# Patient Record
Sex: Female | Born: 1955 | Race: White | Hispanic: No
Health system: Southern US, Community
[De-identification: ages and names within clinical notes are randomized; demographics above are authoritative.]

---

## 2014-03-16 ENCOUNTER — Inpatient Hospital Stay: Payer: Self-pay | Admitting: Internal Medicine

## 2014-03-17 LAB — PROTIME-INR
INR: 1.3
PROTHROMBIN TIME: 15.7 s — AB (ref 11.5–14.7)

## 2014-03-17 LAB — CBC WITH DIFFERENTIAL/PLATELET
BASOS PCT: 0.5 %
Basophil #: 0 10*3/uL (ref 0.0–0.1)
Eosinophil #: 0.1 10*3/uL (ref 0.0–0.7)
Eosinophil %: 2.2 %
HCT: 33.9 % — ABNORMAL LOW (ref 35.0–47.0)
HGB: 10.9 g/dL — ABNORMAL LOW (ref 12.0–16.0)
Lymphocyte #: 1.6 10*3/uL (ref 1.0–3.6)
Lymphocyte %: 28.8 %
MCH: 29.6 pg (ref 26.0–34.0)
MCHC: 32.3 g/dL (ref 32.0–36.0)
MCV: 92 fL (ref 80–100)
Monocyte #: 0.7 x10 3/mm (ref 0.2–0.9)
Monocyte %: 12.3 %
NEUTROS ABS: 3.1 10*3/uL (ref 1.4–6.5)
NEUTROS PCT: 56.2 %
PLATELETS: 405 10*3/uL (ref 150–440)
RBC: 3.7 10*6/uL — ABNORMAL LOW (ref 3.80–5.20)
RDW: 18 % — AB (ref 11.5–14.5)
WBC: 5.5 10*3/uL (ref 3.6–11.0)

## 2014-03-17 LAB — BASIC METABOLIC PANEL
ANION GAP: 6 — AB (ref 7–16)
BUN: 1 mg/dL — AB (ref 7–18)
CHLORIDE: 110 mmol/L — AB (ref 98–107)
CO2: 23 mmol/L (ref 21–32)
CREATININE: 1.04 mg/dL (ref 0.60–1.30)
Calcium, Total: 8.7 mg/dL (ref 8.5–10.1)
EGFR (African American): >60
EGFR (Non-African Amer.): 58 — ABNORMAL LOW
GLUCOSE: 98 mg/dL (ref 65–99)
Osmolality: 273 (ref 275–301)
POTASSIUM: 4.1 mmol/L (ref 3.5–5.1)
Sodium: 139 mmol/L (ref 136–145)

## 2014-03-17 LAB — TPN PANEL
ALBUMIN: 2.4 g/dL — AB (ref 3.4–5.0)
ALK PHOS: 55 U/L
Activated PTT: 32.7 secs (ref 23.6–35.9)
Anion Gap: 7 (ref 7–16)
BUN: 2 mg/dL — AB (ref 7–18)
CALCIUM: 8.2 mg/dL — AB (ref 8.5–10.1)
CO2: 25 mmol/L (ref 21–32)
Chloride: 109 mmol/L — ABNORMAL HIGH (ref 98–107)
Cholesterol: 64 mg/dL (ref 0–200)
Creatinine: 0.97 mg/dL (ref 0.60–1.30)
EGFR (African American): >60
EGFR (Non-African Amer.): >60
Glucose: 88 mg/dL (ref 65–99)
HGB: 9.6 g/dL — ABNORMAL LOW (ref 12.0–16.0)
INR: 1.2
Magnesium: 1 mg/dL — ABNORMAL LOW
Osmolality: 277 (ref 275–301)
PHOSPHORUS: 3.4 mg/dL (ref 2.5–4.9)
PLATELETS: 340 10*3/uL (ref 150–440)
Potassium: 3.9 mmol/L (ref 3.5–5.1)
Prothrombin Time: 15.3 secs — ABNORMAL HIGH (ref 11.5–14.7)
SGOT(AST): 23 U/L (ref 15–37)
SODIUM: 141 mmol/L (ref 136–145)
Total Protein: 5.4 g/dL — ABNORMAL LOW (ref 6.4–8.2)
Triglycerides: 63 mg/dL (ref 0–200)
WBC: 5.3 10*3/uL (ref 3.6–11.0)

## 2014-03-17 LAB — HEPATIC FUNCTION PANEL A (ARMC)
ALK PHOS: 51 U/L
Albumin: 2.5 g/dL — ABNORMAL LOW (ref 3.4–5.0)
Bilirubin, Direct: <0.1 mg/dL (ref 0.00–0.20)
Bilirubin,Total: 0.3 mg/dL (ref 0.2–1.0)
SGOT(AST): 28 U/L (ref 15–37)
SGPT (ALT): 23 U/L (ref 12–78)
Total Protein: 5.1 g/dL — ABNORMAL LOW (ref 6.4–8.2)

## 2014-03-17 LAB — SEDIMENTATION RATE: Erythrocyte Sed Rate: 26 mm/hr (ref 0–30)

## 2014-03-18 LAB — PHOSPHORUS: PHOSPHORUS: 5.2 mg/dL — AB (ref 2.5–4.9)

## 2014-03-18 LAB — ALBUMIN: Albumin: 2.4 g/dL — ABNORMAL LOW (ref 3.4–5.0)

## 2014-03-18 LAB — SODIUM: Sodium: 136 mmol/L (ref 136–145)

## 2014-03-18 LAB — POTASSIUM: Potassium: 4.5 mmol/L (ref 3.5–5.1)

## 2014-03-18 LAB — CALCIUM: Calcium, Total: 7.9 mg/dL — ABNORMAL LOW (ref 8.5–10.1)

## 2014-03-18 LAB — MAGNESIUM: MAGNESIUM: 1.3 mg/dL — AB

## 2014-03-19 LAB — SODIUM: Sodium: 141 mmol/L (ref 136–145)

## 2014-03-19 LAB — CALCIUM: Calcium, Total: 8.1 mg/dL — ABNORMAL LOW (ref 8.5–10.1)

## 2014-03-19 LAB — POTASSIUM: Potassium: 3.6 mmol/L (ref 3.5–5.1)

## 2014-03-19 LAB — PHOSPHORUS: Phosphorus: 4.1 mg/dL (ref 2.5–4.9)

## 2014-03-19 LAB — MAGNESIUM: Magnesium: 1.7 mg/dL — ABNORMAL LOW

## 2014-03-20 LAB — BASIC METABOLIC PANEL
ANION GAP: 4 — AB (ref 7–16)
BUN: 8 mg/dL (ref 7–18)
CALCIUM: 8 mg/dL — AB (ref 8.5–10.1)
CHLORIDE: 106 mmol/L (ref 98–107)
CO2: 29 mmol/L (ref 21–32)
CREATININE: 0.94 mg/dL (ref 0.60–1.30)
EGFR (African American): >60
EGFR (Non-African Amer.): >60
Glucose: 115 mg/dL — ABNORMAL HIGH (ref 65–99)
OSMOLALITY: 277 (ref 275–301)
POTASSIUM: 3.6 mmol/L (ref 3.5–5.1)
Sodium: 139 mmol/L (ref 136–145)

## 2014-03-20 LAB — MAGNESIUM: Magnesium: 1.8 mg/dL

## 2014-03-20 LAB — PLATELET COUNT: PLATELETS: 270 10*3/uL (ref 150–440)

## 2014-03-20 LAB — PHOSPHORUS: Phosphorus: 4.5 mg/dL (ref 2.5–4.9)

## 2014-03-21 LAB — BASIC METABOLIC PANEL
Anion Gap: 4 — ABNORMAL LOW (ref 7–16)
BUN: 13 mg/dL (ref 7–18)
CO2: 31 mmol/L (ref 21–32)
Calcium, Total: 8.3 mg/dL — ABNORMAL LOW (ref 8.5–10.1)
Chloride: 105 mmol/L (ref 98–107)
Creatinine: 0.88 mg/dL (ref 0.60–1.30)
GLUCOSE: 112 mg/dL — AB (ref 65–99)
OSMOLALITY: 280 (ref 275–301)
Potassium: 3.9 mmol/L (ref 3.5–5.1)
SODIUM: 140 mmol/L (ref 136–145)

## 2014-03-21 LAB — PHOSPHORUS: PHOSPHORUS: 4.8 mg/dL (ref 2.5–4.9)

## 2014-03-21 LAB — MAGNESIUM: Magnesium: 2.1 mg/dL

## 2014-03-21 LAB — PROT IMMUNOELECTROPHORES(ARMC)

## 2014-03-22 LAB — SODIUM: SODIUM: 140 mmol/L (ref 136–145)

## 2014-03-22 LAB — PHOSPHORUS: Phosphorus: 4.5 mg/dL (ref 2.5–4.9)

## 2014-03-22 LAB — MAGNESIUM: Magnesium: 2 mg/dL

## 2014-03-22 LAB — POTASSIUM: POTASSIUM: 3.9 mmol/L (ref 3.5–5.1)

## 2014-03-22 LAB — CALCIUM: CALCIUM: 8.6 mg/dL (ref 8.5–10.1)

## 2014-03-23 LAB — MAGNESIUM: Magnesium: 1.8 mg/dL

## 2014-03-23 LAB — ALBUMIN: Albumin: 2.4 g/dL — ABNORMAL LOW (ref 3.4–5.0)

## 2014-03-23 LAB — POTASSIUM: Potassium: 4 mmol/L (ref 3.5–5.1)

## 2014-03-23 LAB — SODIUM: Sodium: 138 mmol/L (ref 136–145)

## 2014-03-23 LAB — CALCIUM: Calcium, Total: 8.6 mg/dL (ref 8.5–10.1)

## 2014-03-23 LAB — PHOSPHORUS: Phosphorus: 4.9 mg/dL (ref 2.5–4.9)

## 2014-03-24 LAB — SODIUM: Sodium: 137 mmol/L (ref 136–145)

## 2014-03-24 LAB — MAGNESIUM: Magnesium: 1.9 mg/dL

## 2014-03-24 LAB — POTASSIUM: Potassium: 4.4 mmol/L (ref 3.5–5.1)

## 2014-03-24 LAB — CALCIUM: CALCIUM: 8.5 mg/dL (ref 8.5–10.1)

## 2014-03-24 LAB — PHOSPHORUS: PHOSPHORUS: 4.7 mg/dL (ref 2.5–4.9)

## 2014-03-25 LAB — CALCIUM: Calcium, Total: 8.6 mg/dL (ref 8.5–10.1)

## 2014-03-25 LAB — SODIUM: SODIUM: 139 mmol/L (ref 136–145)

## 2014-03-25 LAB — CREATININE, SERUM
Creatinine: 1.13 mg/dL (ref 0.60–1.30)
EGFR (African American): >60
GFR CALC NON AF AMER: 53 — AB

## 2014-03-25 LAB — MAGNESIUM: MAGNESIUM: 1.9 mg/dL

## 2014-03-25 LAB — PHOSPHORUS: PHOSPHORUS: 4.5 mg/dL (ref 2.5–4.9)

## 2014-03-25 LAB — POTASSIUM: Potassium: 4.2 mmol/L (ref 3.5–5.1)

## 2014-03-26 LAB — POTASSIUM: Potassium: 4.1 mmol/L (ref 3.5–5.1)

## 2014-03-26 LAB — PHOSPHORUS: PHOSPHORUS: 4.4 mg/dL (ref 2.5–4.9)

## 2014-03-26 LAB — MAGNESIUM: Magnesium: 1.8 mg/dL

## 2014-03-26 LAB — SODIUM: Sodium: 136 mmol/L (ref 136–145)

## 2014-03-26 LAB — CALCIUM: Calcium, Total: 8.7 mg/dL (ref 8.5–10.1)

## 2015-03-04 NOTE — Op Note (Signed)
PATIENT NAME:  Carmen CaffeyHILLIPS, Myisha F MR#:  [id number] DATE OF BIRTH:  03/16/1956  DATE OF PROCEDURE:  03/17/2014  PREOPERATIVE DIAGNOSES:  1. Clostridium difficile colitis.  2. Hypothyroidism.  3. Obesity.   POSTOPERATIVE DIAGNOSES:  1. Clostridium difficile colitis.  2. Hypothyroidism.  3. Obesity.  PROCEDURES:  1. Ultrasound guidance for vascular access to left basilic vein.  2. Fluoroscopic guidance for placement of catheter.  3. Insertion of peripherally inserted central venous catheter, double lumen, left arm.  SURGEON: Dr. Festus BarrenJason Levent Kornegay  ANESTHESIA: Local.   ESTIMATED BLOOD LOSS: Minimal.   INDICATION FOR PROCEDURE: A 59 year old female with Clostridium difficile colitis who needs extended IV therapy.   DESCRIPTION OF PROCEDURE: The patient's left arm was sterilely prepped and draped, and a sterile surgical field was created. The left basilic vein was accessed under direct ultrasound guidance without difficulty with a micropuncture needle and permanent image was recorded. 0.018 wire was then placed into the superior vena cava. Peel-away sheath was placed over the wire. A single lumen peripherally inserted central venous catheter was then placed over the wire and the wire and peel-away sheath were removed. The catheter tip was placed into the superior vena cava and was secured at the skin at 40 cm with a sterile dressing. The catheter withdrew blood well and flushed easily with heparinized saline. The patient tolerated procedure well.    ____________________________ Annice NeedyJason S. Sheva Mcdougle, MD jsd:dd D: 03/17/2014 14:58:28 ET T: 03/18/2014 01:25:57 ET JOB#: [id number]  cc: Annice NeedyJason S. Kamarion Zagami, MD, <Dictator> Annice NeedyJASON S Icie Kuznicki MD ELECTRONICALLY SIGNED 03/24/2014 15:34

## 2015-03-04 NOTE — Consult Note (Signed)
Colonoscopy done, lining looked good, stool transplant done per usual protocol starting in terminal ileum.  Will stop TPN, give low residue diet and very likely home tomorrow.  I will see her in late morning and after that if no new problems can go home on yogurt and Florastar probiotic and mechanical soft diet following instructions per her bariatric surgeon.  Electronic Signatures: Scot JunElliott, Sherrilyn Nairn T (MD)  (Signed on 15-May-15 15:54)  Authored  Last Updated: 15-May-15 15:54 by Scot JunElliott, Atilano Covelli T (MD)

## 2015-03-04 NOTE — H&P (Signed)
PATIENT NAME:  Carmen Thomas, Carmen Thomas MR#:  [id number] DATE OF BIRTH:  1956/08/03  DATE OF ADMISSION:  03/16/2014  ADDENDUM:   ALLERGIES: TAPE.   HOME MEDICATIONS: Include acetaminophen hydrocodone 325/7.5 mg 10 mL every 6 hours as needed for pain, lorazepam 1 mg p.o. 3 times a day as needed for anxiety, Zofran 4 mg p.o. every 6 hours as needed for nausea or vomiting, scopolamine 1.5 mg transdermal patch every 72 hours, Ambien 5 mg p.o. at bedtime as needed for sleep, Restoril 15 mg p.o. at bedtime, levothyroxine 150 mcg p.o. daily, Theragran multivitamin 1 tablet p.o. daily.   LABORATORY DATA:  chloride 110, bicarbonate 23, BUN 1, creatinine 1.04, glucose 98. WBC 5.5, hemoglobin 10.9, platelets of 405,000.   ____________________________ Cletis Athensavid K. Hower, MD dkh:dd D: 03/17/2014 00:59:52 ET T: 03/17/2014 01:45:05 ET JOB#: [id number]  cc: Cletis Athensavid K. Hower, MD, <Dictator> DAVID Synetta ShadowK HOWER MD ELECTRONICALLY SIGNED 03/18/2014 1:01

## 2015-03-04 NOTE — Consult Note (Signed)
Pt seen, consult to be dictated.  Will treat with oral vancomycin, IV TPN due to severe malnutrition with albumin of 2.5, do flex sig this weekend, do stool transplant after improved, may need gamma globulin infusion if IgG low, waiting on SPIE.  Pic line inserted today.    Electronic Signatures: Scot JunElliott, Robert T (MD)  (Signed on 07-May-15 16:37)  Authored  Last Updated: 07-May-15 16:37 by Scot JunElliott, Robert T (MD)

## 2015-03-04 NOTE — Consult Note (Signed)
Pt SPIE showed no gamma globulin deficiency, were low normal.  Stools are mushy, not watery.  Still on iv tpn for severe malnutrition.  to have stool transplant Friday, prep on Thursday. Stop Vancomycin after midnight tomorrow night, no vancomycin or other antibiotics Wed and Thursday. Pt in good spirits, less abd pain, no vomiting, BP better.   Electronic Signatures: Scot JunElliott, Robert T (MD)  (Signed on 11-May-15 17:31)  Authored  Last Updated: 11-May-15 17:31 by Scot JunElliott, Robert T (MD)

## 2015-03-04 NOTE — Consult Note (Signed)
Pt has some pain in abd in left upper, had almost formed stools today, no nausea, no vomiting, no chills, fever.  Will stop vancomycin after today, current diet tomorrow and one gallon  Golytely Thursday afternoon and a clear liq diet all day Thursday and colonoscopy with stool transplant Friday.  Proably watch over weekend and home early next week.  Wil check albumin and prealbumin Thursday.  Pt eating yogurt , seems to be in good spirits.  Electronic Signatures: Scot JunElliott, Denise Washburn T (MD)  (Signed on 12-May-15 18:16)  Authored  Last Updated: 12-May-15 18:16 by Scot JunElliott, Leverett Camplin T (MD)

## 2015-03-04 NOTE — Consult Note (Signed)
PATIENT NAME:  Carmen Thomas, Carmen Thomas MR#:  [id number] DATE OF BIRTH:  1956/08/16  DATE OF CONSULTATION:  03/17/2014  CONSULTING PHYSICIAN:  Manya Silvas, MD  The patient is a 59 year old white female who had a sleeve bypass weight loss reduction procedure on March 3rd, as well as a hiatal hernia repair. She went home and then developed C. diff infection, and because of her combination of C. diff infection and recent surgery, she spent almost 6 weeks in the hospital. She was on TPN, IV Flagyl and oral medications.  She was discharged from the hospital and within a few days had a relapse, admitted back to the hospital, and she had gotten a course of antibiotics also outside the hospital. Because of her recurrences of C. diff, I was contracted by Dr. Duke Salvia for possible stool transplant, so the patient was transferred to [facility] after getting approval from her [place] and was admitted to the hospitalist service where I am consulting on her and managing her C. diff.   PAST MEDICAL HISTORY: Includes hypothyroidism, also includes previous gallbladder surgery laparoscopically, a knee procedure where her "knee was scraped," fracture of the left arm, 08/2013.   FAMILY HISTORY: Father was paralyzed, electrical accident while working on a power line at age 30 and died at age 14. Mother died at age 39 from stomach cancer. Husband died 24 years ago. Positive for rheumatoid arthritis in a brother.   CHRONIC MEDICATIONS: Previously was on hydrochlorothiazide in combination with another medicine. She has not been on this for quite some time. She has been on thyroid medicine.   REVIEW OF SYSTEMS:   HEENT:  She has had some change in vision recently, a little bit of difficulty with reading small print. No change in hearing. No significant headaches. No mouth sores.  RESPIRATORY:  No asthma, wheezing, emphysema, chronic cough or shortness of breath.  CARDIOVASCULAR: No exertional chest pains. No  irregular heartbeats.  GASTROINTESTINAL: No dysphagia. No significant heartburn. She has some crampy abdominal discomfort scattered in her abdomen. She has loose stools. She has had 4 stools today so far, small squirts. She has not had any bleeding in 2 weeks. No significant problem with hemorrhoids.  RECTAL:  No severe anal irritation.  GENITOURINARY: No vaginal bleeding. She had a Pap smear last year. She had a mammogram last year as well.  SKIN:  No skin rashes.  LYMPHATICS:  No swollen glands or lymph nodes.   PSYCHIATRIC: No known psychiatric problems She had been anxious and somewhat depressed because of her prolonged illness and has frequent, brief crying spells, which would be considered appropriate, given her recent problems.   Her last colonoscopy was 5 years ago, and she thinks she had a polyp removed.   She does state that she has not been off antibiotics more than 3 days ever since her surgery, whether antibiotics in the hospital or out.  Previously she was given a course of vancomycin and she had to take a week of Septra because of urinary tract infection as well.   Looking at her records in the hospital, at [facility], on 04/29, her albumin was 3.5. Her magnesium was 1.3. Hemoglobin 11.9, white count 6.8, and her stool was positive for C. difficile positive for over 027 strain of bacteria. On 05/03, her hemoglobin was down to 8.9. Magnesium level, which was up to 2.2 was down to 1.5.   PHYSICAL EXAMINATION: GENERAL: White female in no acute distress.  Some greenish tint  to her left arm where she had a PICC line put in.  VITAL SIGNS:  Blood pressure 177/98 after procedure, repeat 162/87; pulse 112, temp 98.3, O2 sat 95% on room air.   HEENT: Sclerae anicteric. Conjunctivae negative. Tongue negative.  NECK: No thyromegaly. No carotid bruits.  CHEST: Clear.  HEART:  Shows a 1/6 brief systolic murmur.  BREASTS: Exam not done.  ABDOMEN: Recent small incision area with some previous  surgery. No hepatosplenomegaly. No masses. No bruits. No peritoneal signs. There is some mild tenderness to palpation in the mid abdomen.  EXTREMITIES: Legs slightly puffy. No swollen calves.  SKIN:  Warm and dry.  PSYCHIATRIC: Mood and affect are appropriate.  RECTAL: Exam not done at this time. Will be done at the time of flexible sigmoidoscopy   LABORATORY DATA: Shows glucose 98, BUN 1, creatinine 1.04, sodium 139, potassium 4.1, chloride 110, CO2 of 23, calcium 8.7, total protein 5.1, albumin 2.5, total bili 0.3, direct bili less than 0.1, alk phos 51, SGOT 28, SGPT 23. Sed rate of 26. White count 5.5, hemoglobin 10.9, platelet count 405. Pro time of 15.7. Three-way of the abdomen shows no significant abnormalities.   ASSESSMENT:   1.  Recurrent Clostridium diff. She has had at least 3 episodes of antibiotics for this and has basically been on antibiotics for 2 months without resolution of her problem.  2. Malnourishment. 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  3.  Poor IV access.  4.  Situational anxiety.   PLAN:  1.  A PICC line was inserted and she will be started on TPN because of her malnourishment.  2. Check serum protein immunoelectrophoresis. 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 Before giving an infusion, I would like to check this and confirm it. 3.   Vancomycin 250 mg p.o. q.i.d.  She had been on vancomycin for 6 days.  Normally I require 2 weeks of therapy before doing a stool transplant, and we will put her back on the 250 q.i.d. and do a stool transplant after this course of antibiotics is finished, and then 2 days off vancomycin. Plan to do a flexible sigmoidoscopy just to look at the rectum, rectosigmoid area to see how inflamed her colon is at this time. We will start her on  some yogurt and probiotic as well. Plans were discussed with the patient and she is in agreement.    ____________________________ Manya Silvas, MD rte:dmm D: 03/17/2014 17:10:50 ET T: 03/17/2014 19:28:11 ET JOB#: [id number]  cc: Manya Silvas, MD, <Dictator> Venia Carbon. Duke Salvia, MD Manya Silvas MD ELECTRONICALLY SIGNED 04/06/2014 17:45

## 2015-03-04 NOTE — Consult Note (Signed)
Pt in good spirits, drinking Golytely, about 1/3 gone.  Plan colonoscopy and stool transplant tomorrow.  No vomiting of prep. VSS afebrile. Discussed post hospital plans and recommendations.  Electronic Signatures: Scot JunElliott, Robert T (MD)  (Signed on 14-May-15 18:20)  Authored  Last Updated: 14-May-15 18:20 by Scot JunElliott, Robert T (MD)

## 2015-03-04 NOTE — Discharge Summary (Signed)
Dates of Admission and Diagnosis:  Date of Admission 16-Mar-2014   Date of Discharge 26-Mar-2014   Admitting Diagnosis c diff   Final Diagnosis 1. C diff 2. Malnutrition 3. Hypomagnesemia    Chief Complaint/History of Present Illness CHIEF COMPLAINT: Diarrhea.   HISTORY OF PRESENT ILLNESS:  This is a 59 year old Caucasian female with past medical history of gastroesophageal reflux disease, hypothyroidism, hypertension, obstructive sleep apnea, as well as recurrent Clostridium difficile who is presenting from [facility] as a direct admission. The patient was apparently accepted by Dr. Vira Agar for recurrent Clostridium difficile requiring fecal transplant.   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 Duke Salvia for gastric bypass surgery. Shortly after this procedure, she developed profuse watery diarrhea, multiple bouts daily, diagnosed subsequently with Clostridium difficile. She completed a course of the Flagyl with initial improvement, however relapsed followed by vancomycin and Flagyl. Once again, initial improvement and then relapsed.  They finally placed her on Dificid; however, continues to have symptoms. She has required IV fluid hydration in multiple bouts .  Was shortly placed on TPN.  Unfortunately, she was not tolerating a p.o. diet.  She is still having persistent watery diarrhea. States that she had approximately 11 bowel movements today with associated abdominal pain noted as cramping, periumbilical, 5-6 out of 10 in intensity, nonradiating. No worsening or relieving factors. Currently, she is without complaints.   Allergies:  Adhesive: Blisters  Hepatic:  07-May-15 11:22   Albumin, Serum  2.5  SGOT (AST) 28  Alkaline Phosphatase 51 (45-117 NOTE: New Reference Range 10/01/13)  Total Protein, Serum  5.1  Bilirubin, Total 0.3  Bilirubin, Direct < 0.1 (Result(s) reported on 17 Mar 2014 at 11:58AM.)  SGPT (ALT) 23    20:00   Albumin, Serum  2.4  SGOT  (AST) 23 (Result(s) reported on 17 Mar 2014 at 08:53PM.)  Alkaline Phosphatase 55 (45-117 NOTE: New Reference Range 10/01/13)  Total Protein, Serum  5.4 (Result(s) reported on 17 Mar 2014 at 08:53PM.)  General Ref:  07-May-15 11:22   C-Reactive Protein ========== TEST NAME ==========  ========= RESULTS =========  = REFERENCE RANGE =  C-REACTIVE PROTEIN,QUANT  C-Reactive Protein, Quant C-Reactive Protein, Quant       [H  8.0 mg/L             ]           0.0-4.9               [facility]            No: [id number]           [address], [place], [place] [address]           Lindon Romp, MD         [phone]   Result(s) reported on 18 Mar 2014 at 05:52AM.  Protein Immunoelectrophoresis, Serum ========== TEST NAME ==========  ========= RESULTS =========  = REFERENCE RANGE =  PROT IMMUNOELECTROPHORES  IFE and PE, Serum Immunoglobulin G, Qn, Serum     [   848 mg/dL            ]          [phone] Immunoglobulin A, Qn, Serum     [   95 mg/dL             ]            91-414 Immunoglobulin M, Qn, Serum     [   99 mg/dL             ]  40-230 Protein, Total, Serum           [L  4.5 g/dL             ]           6.0-8.5 Albumin                         [L  2.6 g/dL             ]           3.2-5.6 Alpha-1-Globulin                [   0.2 g/dL             ]           0.1-0.4 Alpha-2-Globulin                [   0.4 g/dL             ]           0.4-1.2 Beta Globulin                   [L  0.5 g/dL             ]           0.6-1.3 Gamma Globulin                  [   0.8 g/dL             ]           0.5-1.6 M-Spike                         [   Not Observed g/dL    ]      Not Observed Globulin, Total                 [L  1.9 g/dL             ]           2.0-4.5 A/G Ratio                  [   1.4                  ]           0.7-2.0 Immunofixation Result, Serum    [   Final Report         ]                   An apparent normal immunofixation pattern. Please note:                     [   Final Report         ]            Protein electrophoresis scan will follow via computer, mail, or courier delivery.               [facility]            No: [id number]           [address], [place], [place] [address]           Lindon Romp, MD   [phone]   Result(s) reported on 21 Mar 2014 at 12:49PM.    20:00  Prealbumin ========== TEST NAME ==========  ========= RESULTS =========  = REFERENCE RANGE =  TPN PANEL  Prealbumin Prealbumin                      [L  11 mg/dL             ]             20-40               [facility]            No: [id number]           [address], [place], [place] [address]           Lindon Romp, MD         [phone]   Result(s) reported on 19 Mar 2014 at 05:49AM.  Routine Chem:  06-May-15 23:50   Sodium, Serum 139  Potassium, Serum 4.1  Calcium (Total), Serum 8.7  Creatinine (comp) 1.04  eGFR (African American) >60  eGFR (Non-African American)  58 (eGFR values <24m/min/1.73 m2 may be an indication of chronic kidney disease (CKD). Calculated eGFR is useful in patients with stable renal function. The eGFR calculation will not be reliable in acutely ill patients when serum creatinine is changing rapidly. It is not useful in  patients on dialysis. The eGFR calculation may not be applicable to patients at the low and high extremes of body sizes, pregnant women, and vegetarians.)  Glucose, Serum 98  BUN  1  Chloride, Serum  110  CO2, Serum 23  Anion Gap  6  Osmolality (calc) 273  07-May-15 20:00   Sodium, Serum 141  Potassium, Serum 3.9  Magnesium, Serum  1.0 (1.8-2.4 THERAPEUTIC RANGE: 4-7 mg/dL TOXIC: > 10 mg/dL  -----------------------)  Phosphorus, Serum 3.4  Calcium (Total), Serum  8.2  Creatinine (comp) 0.97  eGFR (African American) >60  eGFR (Non-African American) >60 (eGFR values <620mmin/1.73 m2 may be an indication of chronic kidney disease (CKD). Calculated eGFR is  useful in patients with stable renal function. The eGFR calculation will not be reliable in acutely ill patients when serum creatinine is changing rapidly. It is not useful in  patients on dialysis. The eGFR calculation may not be applicable to patients at the low and high extremes of body sizes, pregnant women, and vegetarians.)  Glucose, Serum 88  BUN  2  Chloride, Serum  109  CO2, Serum 25  Anion Gap 7  Osmolality (calc) 277  Cholesterol, Serum 64 (Result(s) reported on 17 Mar 2014 at 08:53PM.)  Triglycerides, Serum 63 (Result(s) reported on 17 Mar 2014 at 08:53PM.)  15-May-15 04:13   Sodium, Serum 139 (Result(s) reported on 25 Mar 2014 at 04:57AM.)  Potassium, Serum 4.2 (Result(s) reported on 25 Mar 2014 at 04:57AM.)  Magnesium, Serum 1.9 (1.8-2.4 THERAPEUTIC RANGE: 4-7 mg/dL TOXIC: > 10 mg/dL  -----------------------)  Phosphorus, Serum 4.5 (Result(s) reported on 25 Mar 2014 at 04:59AM.)  Calcium (Total), Serum 8.6 (Result(s) reported on 25 Mar 2014 at 04:57AM.)  Creatinine (comp) 1.13  eGFR (African American) >60  eGFR (Non-African American)  53 (eGFR values <6027min/1.73 m2 may be an indication of chronic kidney disease (CKD). Calculated eGFR is useful in patients with stable renal function. The eGFR calculation will not be reliable in acutely ill patients when serum creatinine is changing rapidly. It is not useful in  patients on dialysis. The eGFR calculation may not be applicable to patients at the low and high  extremes of body sizes, pregnant women, and vegetarians.)  16-May-15 05:48   Sodium, Serum 136 (Result(s) reported on 26 Mar 2014 at 06:17AM.)  Potassium, Serum 4.1 (Result(s) reported on 26 Mar 2014 at 06:17AM.)  Magnesium, Serum 1.8 (1.8-2.4 THERAPEUTIC RANGE: 4-7 mg/dL TOXIC: > 10 mg/dL  -----------------------)  Phosphorus, Serum 4.4 (Result(s) reported on 26 Mar 2014 at 06:19AM.)  Calcium (Total), Serum 8.7 (Result(s) reported on 26 Mar 2014 at  06:17AM.)  Routine Coag:  07-May-15 11:22   Prothrombin  15.7  INR 1.3 (INR reference interval applies to patients on anticoagulant therapy. A single INR therapeutic range for coumarins is not optimal for all indications; however, the suggested range for most indications is 2.0 - 3.0. Exceptions to the INR Reference Range may include: Prosthetic heart valves, acute myocardial infarction, prevention of myocardial infarction, and combinations of aspirin and anticoagulant. The need for a higher or lower target INR must be assessed individually. Reference: The Pharmacology and Management of the Vitamin K  antagonists: the seventh ACCP Conference on Antithrombotic and Thrombolytic Therapy. MEQAS.3419 Sept:126 (3suppl): N9146842. A HCT value >55% may artifactually increase the PT.  In one study,  the increase was an average of 25%. Reference:  "Effect on Routine and Special Coagulation Testing Values of Citrate Anticoagulant Adjustment in Patients with High HCT Values." American Journal of Clinical Pathology 2006;126:400-405.)    20:00   Prothrombin  15.3  INR 1.2 (INR reference interval applies to patients on anticoagulant therapy. A single INR therapeutic range for coumarins is not optimal for all indications; however, the suggested range for most indications is 2.0 - 3.0. Exceptions to the INR Reference Range may include: Prosthetic heart valves, acute myocardial infarction, prevention of myocardial infarction, and combinations of aspirin and anticoagulant. The need for a higher or lower target INR must be assessed individually. Reference: The Pharmacology and Management of the Vitamin K  antagonists: the seventh ACCP Conference on Antithrombotic and Thrombolytic Therapy. QQIWL.7989 Sept:126 (3suppl): N9146842. A HCT value >55% may artifactually increase the PT.  In one study,  the increase was an average of 25%. Reference:  "Effect on Routine and Special Coagulation Testing  Values of Citrate Anticoagulant Adjustment in Patients with High HCT Values." American Journal of Clinical Pathology 2006;126:400-405.)  Activated PTT (APTT) 32.7 (A HCT value >55% may artifactually increase the APTT. In one study, the increase was an average of 19%. Reference: "Effect on Routine and Special Coagulation Testing Values of Citrate Anticoagulant Adjustment in Patients with High HCT Values." American Journal of Clinical Pathology 2006;126:400-405.)  Routine Hem:  06-May-15 23:50   Platelet Count (CBC) 405  WBC (CBC) 5.5  Hemoglobin (CBC)  10.9  RBC (CBC)  3.70  Hematocrit (CBC)  33.9  MCV 92  MCH 29.6  MCHC 32.3  RDW  18.0  Neutrophil % 56.2  Lymphocyte % 28.8  Monocyte % 12.3  Eosinophil % 2.2  Basophil % 0.5  Neutrophil # 3.1  Lymphocyte # 1.6  Monocyte # 0.7  Eosinophil # 0.1  Basophil # 0.0 (Result(s) reported on 17 Mar 2014 at 12:27AM.)  07-May-15 12:24   Erythrocyte Sed Rate 26 (Result(s) reported on 17 Mar 2014 at 01:21PM.)    20:00   Platelet Count (CBC) 340 (Result(s) reported on 17 Mar 2014 at 08:24PM.)  WBC (CBC) 5.3 (Result(s) reported on 17 Mar 2014 at 08:24PM.)  Hemoglobin (CBC)  9.6 (Result(s) reported on 17 Mar 2014 at 08:24PM.)   Pertinent Past History:  Pertinent Past History PAST MEDICAL HISTORY: Gastroesophageal  reflux disease, hypothyroidism, hypertension, history of acute kidney injury, obstructive sleep apnea as well as Clostridium difficile.   Hospital Course:  Hospital Course 1. Clostridium difficile colitis. Refractery to treatments - IvIG normal PO vancomycin stopped 5/13  Continued TPN during hospital stay for malnutrition - s/p flex sig. Stool transpant done 5/15. Pt did well. Tolerating food.  2. Malnutrition - TPN  3. Hypomagnesemia - replaced iv mag  4. hypothyroidism - levothyroxine  5. obsesity- s/p gastric banding - edema seen in stomach- likely from gastric sleeve surgery  On day of d/c Abd- Soft, NT, BS  present S1, S2 Lungs CTA  Time spent on d/c 40 min   Condition on Discharge Fair   Code Status:  Code Status Full Code   DISCHARGE INSTRUCTIONS HOME MEDS:  Medication Reconciliation: Patient's Home Medications at Discharge:     Medication Instructions  levothyroxine 150 mcg (0.15 mg) oral capsule  1 cap(s) orally once a day at 0600   lorazepam 1 mg oral tablet  1 tab(s) orally 3 times a day, As Needed - for Anxiety, Nervousness   theragran therapeutic multiple vitamins oral tablet  1 tab(s) sublingual once a day   ondansetron 4 mg oral tablet, disintegrating  4 milligram(s) orally every 6 hours, As Needed - for Nausea, Vomiting   scopolamine 1.5 mg transdermal film, extended release  1 patch transdermal every 72 hours   restoril 15 mg oral capsule  1 cap(s) orally once a day (at bedtime), As Needed - for Inability to Sleep   ambien 5 mg oral tablet  1 tab(s) orally once a day (at bedtime), As Needed - for Inability to Sleep   acetaminophen-hydrocodone 325 mg-7.5 mg/15 ml oral solution  10 milliliter(s) orally every 6 hours, As Needed - for Pain   florastor 250 mg oral capsule  1 cap(s) orally 2 times a day     Physician's Instructions:  Diet Regular  yogurt   Activity Limitations As tolerated   Return to Work Not Applicable   Time frame for Follow Up Appointment 1-2 weeks  Dr. Vira Agar   Electronic Signatures: Darvin Neighbours, Lottie Dawson (MD)  (Signed 19-May-15 10:42)  Authored: ADMISSION DATE AND DIAGNOSIS, CHIEF COMPLAINT/HPI, Allergies, PERTINENT LABS, PERTINENT PAST HISTORY, HOSPITAL COURSE, DISCHARGE INSTRUCTIONS HOME MEDS, PATIENT INSTRUCTIONS   Last Updated: 19-May-15 10:42 by Alba Destine (MD)

## 2015-03-04 NOTE — Consult Note (Signed)
Pt feeling better, stool forming up, no nausea or vomiting.  VSS afebrile. Abd mild tender on palpation.  Chest clear.  Appetite good. Finished vancomycin last night.  Per my protocol will do prep tomorrow and colonoscopy with stool transplant Friday.  Clear liq diet tomorrow. Albumin still low and Mg borderline.  May need more magnesium infusion today or tomorrow.  Home Sunday, possibly Sat depending on how colon looks.  Electronic Signatures: Scot JunElliott, Robert T (MD)  (Signed on 13-May-15 14:12)  Authored  Last Updated: 13-May-15 14:12 by Scot JunElliott, Robert T (MD)

## 2015-03-04 NOTE — Consult Note (Signed)
I visited patient to discuss post hospitalization follow up, expected clinical course from here, and post fecal transplant visit.  Pt given phone numbers on how to reach me if needed.  Instructed to eat yogurt and take probiotic and wash hands frequently. Follow up by phone later on in the week.  OK to discharge home.  Electronic Signatures: Scot JunElliott, Daegen Berrocal T (MD)  (Signed on 16-May-15 10:49)  Authored  Last Updated: 16-May-15 10:49 by Scot JunElliott, Jaquesha Boroff T (MD)

## 2015-03-04 NOTE — H&P (Signed)
PATIENT NAME:  Carmen Thomas, Carmen Thomas MR#:  [id number] DATE OF BIRTH:  Mar 11, 1956  DATE OF ADMISSION:  03/16/2014  PRIMARY CARE PHYSICIAN:  Nonlocal.   REFERRING PHYSICIAN:  Dr. Casimiro NeedleMichael Tyner/Dr. Mechele CollinElliott.   CHIEF COMPLAINT: Diarrhea.   HISTORY OF PRESENT ILLNESS:  This is a 59 year old Caucasian female with past medical history of gastroesophageal reflux disease, hypothyroidism, hypertension, obstructive sleep apnea, as well as recurrent Clostridium difficile who is [facility]. The patient was apparently accepted by Dr. Mechele CollinElliott for recurrent Clostridium difficile requiring fecal transplant.   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 Alva Garnetyner for gastric bypass surgery. Shortly after this procedure, she developed profuse watery diarrhea, multiple bouts daily, diagnosed subsequently with Clostridium difficile. She completed a course of the Flagyl with initial improvement, however relapsed followed by vancomycin and Flagyl. Once again, initial improvement and then relapsed.  They finally placed her on Dificid; however, continues to have symptoms. She has required IV fluid hydration in multiple bouts .  Was shortly placed on TPN.  Unfortunately, she was not tolerating a p.o. diet.  She is still having persistent watery diarrhea. States that she had approximately 11 bowel movements today with associated abdominal pain noted as cramping, periumbilical, 5-6 out of 10 in intensity, nonradiating. No worsening or relieving factors. Currently, she is without complaints.   REVIEW OF SYSTEMS:  CONSTITUTIONAL: Positive for fatigue, weakness. Denies any fevers, chills. EYES: Denied blurred vision, double vision, eye pain.  EARS, NOSE, THROAT: Denies tinnitus, ear pain, hearing loss.  RESPIRATORY: Denies cough, wheeze, shortness of breath.  CARDIOVASCULAR: Denies chest pain, palpitations, edema.  GASTROINTESTINAL: Positive for nausea, diarrhea, abdominal  pain as described above. Denies any vomiting.  GENITOURINARY: Denies dysuria, hematuria.  ENDOCRINE: Denies nocturia or thyroid problems.  HEMATOLOGY: Denies easy bruising, bleeding.  SKIN: Denies rash or lesions.  MUSCULOSKELETAL: Denies pain in neck, back, shoulder, knees, hips or arthritic symptoms.  NEUROLOGIC: Denies paralysis or paresthesias.  PSYCHIATRIC: Denies anxiety or depressive symptoms.    Otherwise, full review of systems performed is negative.   PAST MEDICAL HISTORY: Gastroesophageal reflux disease, hypothyroidism, hypertension, history of acute kidney injury, obstructive sleep apnea as well as Clostridium difficile.   SOCIAL HISTORY: Positive for occasional alcohol usage. Denies any tobacco or drug usage.   FAMILY HISTORY: Positive for rheumatoid arthritis.   PHYSICAL EXAMINATION:  VITAL SIGNS: Temperature 97.9, heart rate 105, respirations 18, blood pressure 138/90, saturating 97% on room air.  GENERAL: Well-nourished, well-developed Caucasian female, currently in no distress.  HEAD: Normocephalic, atraumatic.  EYES: Pupils equal, round and reactive to light.  Extraocular muscles intact. No scleral icterus.  MOUTH: Moist mucous membranes. Dentition intact. No abscess is noted.  EARS, NOSE, AND THROAT:  Clear of exudates. No external lesions.  NECK: Supple. No thyromegaly. No nodules. No JVD.  PULMONARY: Clear to auscultation bilaterally without wheezes, rales, or rhonchi. No use of accessory muscles. Good respiratory effort.  Chest wall nontender to palpation.  CARDIOVASCULAR: S1, S2.  Regular rate and rhythm. No murmurs, rubs or gallops. No edema. Pedal pulses 2+ bilaterally.  GASTROINTESTINAL: Soft, minimal tenderness to palpation but without rebound or guarding, Nondistended. No masses. Positive bowel sounds. No hepatosplenomegaly. MUSCULOSKELETAL: No swelling, clubbing, or edema. Range of motion full in all extremities.  NEUROLOGIC: Cranial nerves II through XII  intact. No gross focal neurological deficits. Sensation intact. Reflexes intact.  SKIN: No ulcerations, lesions, rashes or cyanosis. Skin warm and dry. Turgor intact. PSYCHIATRIC:  Mood and affect within normal limits. The patient alert and oriented x 3. Insight and judgment intact.   LABORATORY DATA: Pending at this time.   ASSESSMENT AND PLAN:  A 59 year old Caucasian female with a history of gastroesophageal reflux disease, hypothyroidism, hypertension, and recurrent Clostridium difficile in presenting from the [facility], admitted by Dr. Mechele Collin. 1. Recurrent Clostridium difficile.  Consult gastroenterology. He accepted the patient with plans for fecal transplant.  In the meantime, we will continue oral vancomycin, IV fluid hydration. We will await the laboratory data which is pending at this time.  2. Hypothyroidism. Continue with Synthroid.  3. Venous thromboembolism prophylaxis. Heparin subcutaneously.   CODE STATUS:  The patient is full code.   TIME SPENT: 45 minutes.   We will follow laboratory data and adjust any medication as required.    ____________________________ Cletis Athens. Ryan Palermo, MD dkh:dd/am D: 03/17/2014 00:15:07 ET T: 03/17/2014 01:11:30 ET JOB#: [id number]  cc: Cletis Athens. Hagen Bohorquez, MD, <Dictator> Zamier Eggebrecht Synetta Shadow MD ELECTRONICALLY SIGNED 03/18/2014 1:01

## 2015-03-04 NOTE — Consult Note (Signed)
EGD into esophagus and proximal stomach and then aborted due to poor visability due to edema in walls.  Flex sig showed loss of vascular pattern slight edema, no pseudomembranes present.  Plan is to give TPN and build up her nutrition, continue vancomycin, diet as tol given her recent surgery, stool transplant Friday 5/15, Await SPIE to see if needs gammaglobulin infusion.  Electronic Signatures: Scot JunElliott, Onnika Siebel T (MD)  (Signed on 09-May-15 10:19)  Authored  Last Updated: 09-May-15 10:19 by Scot JunElliott, Gautham Hewins T (MD)

## 2015-03-04 NOTE — Consult Note (Signed)
CC recurrent C. diff infections.  Pt feeling better every day. Stool starting to form up, mushy in form.  No abd pain today.  [facility]  Discussed planswith patient and her two children who are visiting this morning.  Will stop vancomycin Wednesday and do colon on Friday.  Keep in hospital for 2-3 days to be sure no relapse  then discharge home to follow up with me.  Mg runs low, discussed with Dr; Hilton SinclairWeiting.  To get more today.  Changed to Lovenox so only got one shot a day.  Eating yogurt as requested.  Pt spirits are definitely improved.  No vomiting.    Electronic Signatures: Scot JunElliott, Keiandre Cygan T (MD)  (Signed on 10-May-15 12:17)  Authored  Last Updated: 10-May-15 12:17 by Scot JunElliott, Alek Poncedeleon T (MD)

## 2015-11-05 IMAGING — CR DG ABDOMEN 3V
1 series · 3 of 3 positions shown · non-contrast
Comparison: None.

CLINICAL DATA: Abdominal pain.

EXAM:
ABDOMEN SERIES

[Series 1: w abdomen upright · 0.14mm/px · 3 of 3 slices shown]
[im 1/3]
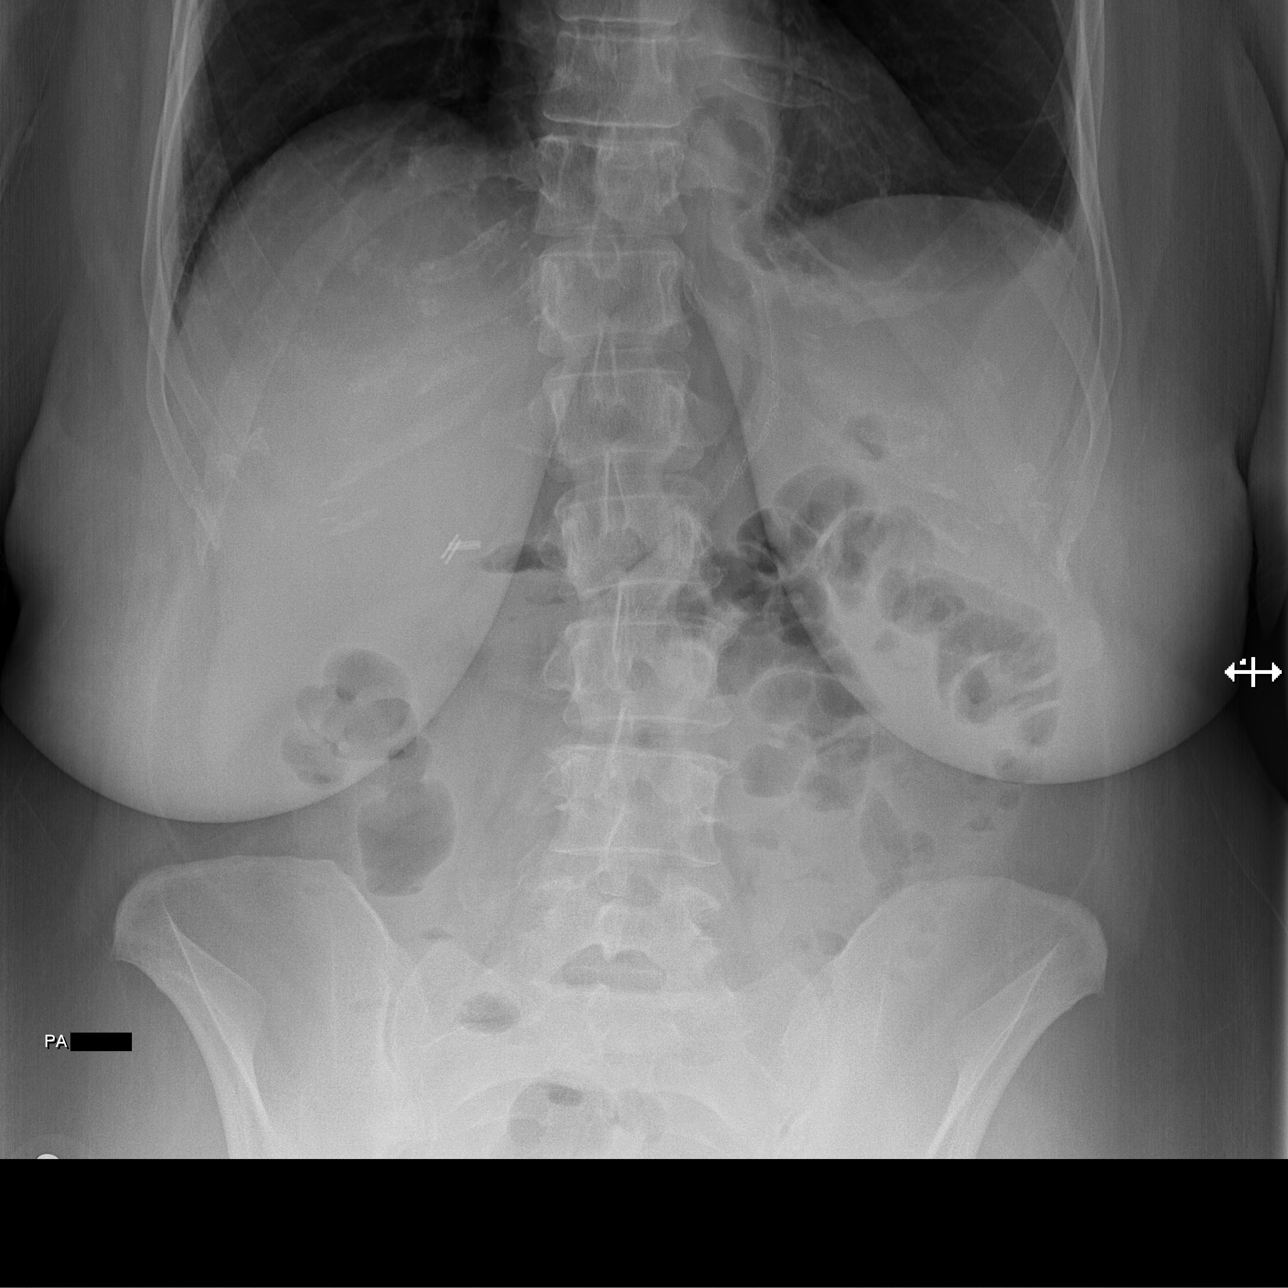
[im 2/3]
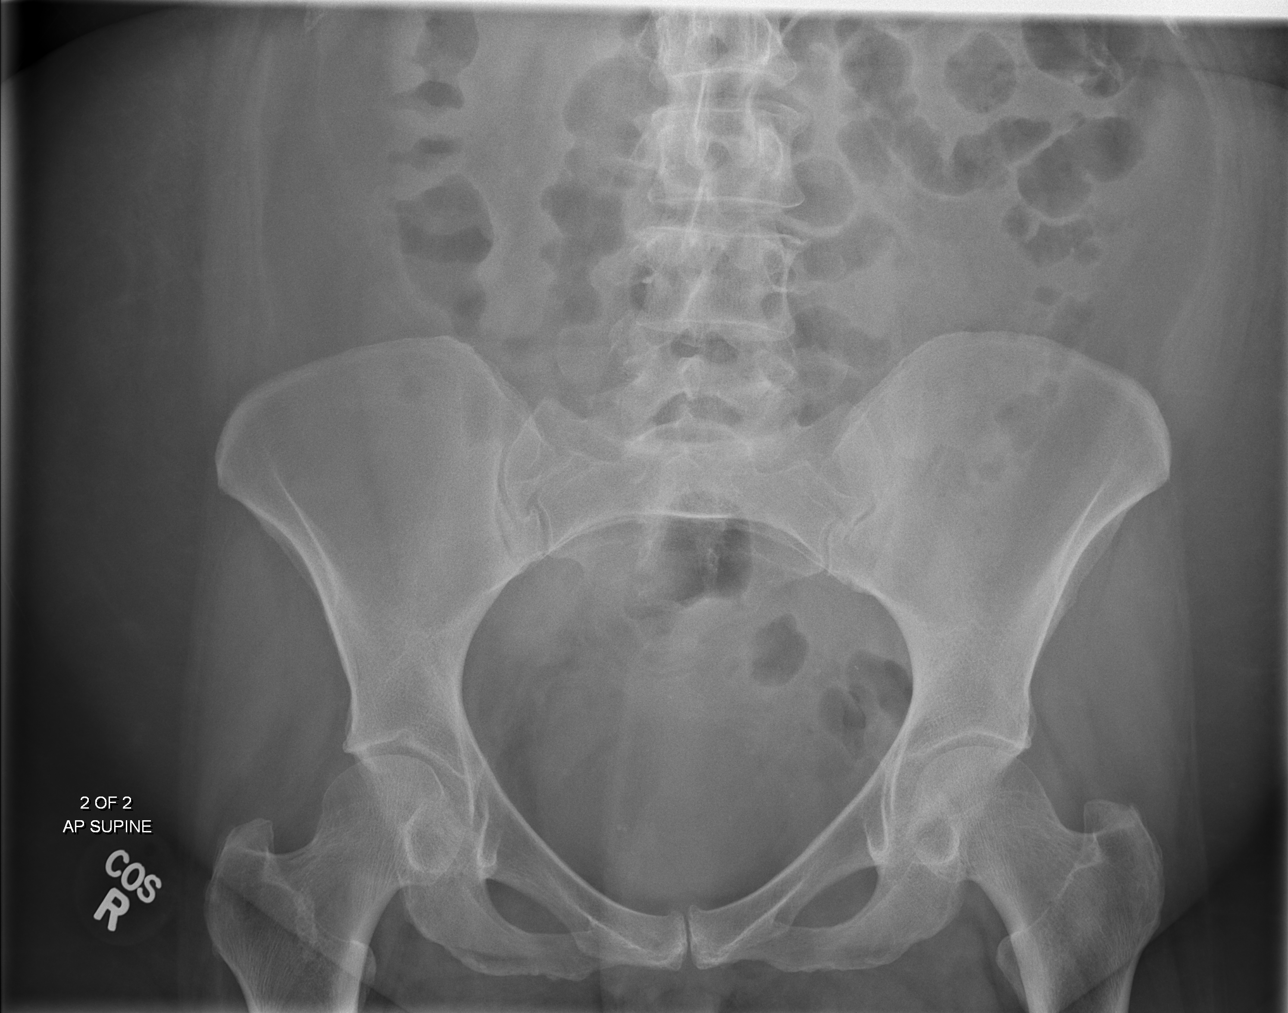
[im 3/3]
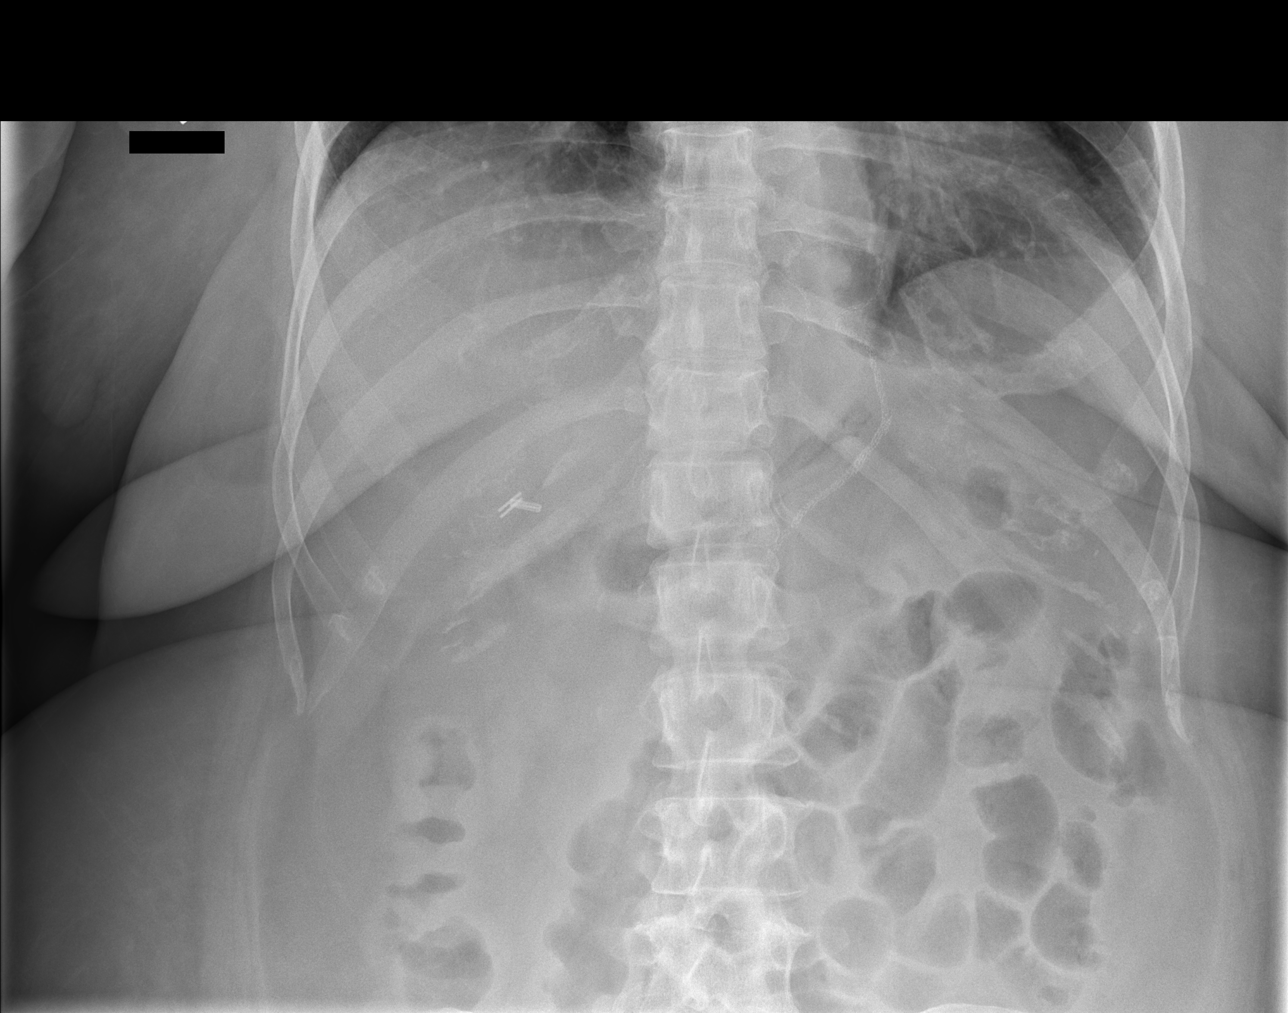

[3 of 3 positions shown; findings below may reference images not displayed]

FINDINGS: There is no bowel dilation to suggest obstruction. No free air.
There are few air-fluid levels on the erect view. This may reflect a
mild adynamic ileus or gastroenteritis.

There has been a cholecystectomy. Along anastomosis staple line
extends across the greater curvature of the stomach. Soft tissues
are otherwise unremarkable. There is a mild levoscoliosis of the
lumbar spine.
IMPRESSION: No obstruction or free air. Scattered air-fluid levels on the erect
view suggests a mild adynamic ileus or gastroenteritis.

## 2021-03-06 ENCOUNTER — Other Ambulatory Visit (HOSPITAL_COMMUNITY): Payer: Self-pay
